# Patient Record
Sex: Male | Born: 1975 | Race: Black or African American | Hispanic: No | Marital: Single | Smoking: Current some day smoker
Health system: Southern US, Community
[De-identification: ages and names within clinical notes are randomized; demographics above are authoritative.]

## PROBLEM LIST (undated history)

## (undated) DIAGNOSIS — I10 Essential (primary) hypertension: Secondary | ICD-10-CM

## (undated) DIAGNOSIS — J45909 Unspecified asthma, uncomplicated: Secondary | ICD-10-CM

## (undated) DIAGNOSIS — E7800 Pure hypercholesterolemia, unspecified: Secondary | ICD-10-CM

---

## 2014-05-15 ENCOUNTER — Emergency Department (HOSPITAL_COMMUNITY): Payer: No Typology Code available for payment source

## 2014-05-15 ENCOUNTER — Emergency Department (HOSPITAL_COMMUNITY)
Admission: EM | Admit: 2014-05-15 | Discharge: 2014-05-15 | Disposition: A | Discharge status: Home / Self Care | Payer: No Typology Code available for payment source | Attending: Emergency Medicine | Admitting: Emergency Medicine

## 2014-05-15 ENCOUNTER — Encounter (HOSPITAL_COMMUNITY): Payer: Self-pay | Admitting: Emergency Medicine

## 2014-05-15 DIAGNOSIS — I10 Essential (primary) hypertension: Secondary | ICD-10-CM | POA: Insufficient documentation

## 2014-05-15 DIAGNOSIS — J40 Bronchitis, not specified as acute or chronic: Secondary | ICD-10-CM

## 2014-05-15 DIAGNOSIS — J45909 Unspecified asthma, uncomplicated: Secondary | ICD-10-CM | POA: Insufficient documentation

## 2014-05-15 DIAGNOSIS — Z79899 Other long term (current) drug therapy: Secondary | ICD-10-CM | POA: Insufficient documentation

## 2014-05-15 DIAGNOSIS — E7800 Pure hypercholesterolemia, unspecified: Secondary | ICD-10-CM | POA: Insufficient documentation

## 2014-05-15 DIAGNOSIS — F17200 Nicotine dependence, unspecified, uncomplicated: Secondary | ICD-10-CM | POA: Insufficient documentation

## 2014-05-15 HISTORY — DX: Essential (primary) hypertension: I10

## 2014-05-15 HISTORY — DX: Unspecified asthma, uncomplicated: J45.909

## 2014-05-15 HISTORY — DX: Pure hypercholesterolemia, unspecified: E78.00

## 2014-05-15 LAB — CBC WITH DIFFERENTIAL/PLATELET
Basophils Absolute: 0 10*3/uL (ref 0.0–0.1)
Basophils Relative: 0 % (ref 0–1)
EOS PCT: 1 % (ref 0–5)
Eosinophils Absolute: 0.1 10*3/uL (ref 0.0–0.7)
HCT: 41.5 % (ref 39.0–52.0)
HEMOGLOBIN: 14.5 g/dL (ref 13.0–17.0)
LYMPHS PCT: 21 % (ref 12–46)
Lymphs Abs: 2.4 10*3/uL (ref 0.7–4.0)
MCH: 32.1 pg (ref 26.0–34.0)
MCHC: 34.9 g/dL (ref 30.0–36.0)
MCV: 91.8 fL (ref 78.0–100.0)
MONO ABS: 1.5 10*3/uL — AB (ref 0.1–1.0)
MONOS PCT: 13 % — AB (ref 3–12)
NEUTROS ABS: 7.5 10*3/uL (ref 1.7–7.7)
Neutrophils Relative %: 65 % (ref 43–77)
Platelets: 251 10*3/uL (ref 150–400)
RBC: 4.52 MIL/uL (ref 4.22–5.81)
RDW: 12.5 % (ref 11.5–15.5)
WBC: 11.5 10*3/uL — ABNORMAL HIGH (ref 4.0–10.5)

## 2014-05-15 LAB — BASIC METABOLIC PANEL
Anion gap: 17 — ABNORMAL HIGH (ref 5–15)
BUN: 9 mg/dL (ref 6–23)
CALCIUM: 8.9 mg/dL (ref 8.4–10.5)
CHLORIDE: 99 meq/L (ref 96–112)
CO2: 23 meq/L (ref 19–32)
CREATININE: 0.94 mg/dL (ref 0.50–1.35)
GFR calc Af Amer: >90 mL/min (ref >90)
GFR calc non Af Amer: >90 mL/min (ref >90)
Glucose, Bld: 104 mg/dL — ABNORMAL HIGH (ref 70–99)
Potassium: 3.5 mEq/L — ABNORMAL LOW (ref 3.7–5.3)
Sodium: 139 mEq/L (ref 137–147)

## 2014-05-15 LAB — RAPID URINE DRUG SCREEN, HOSP PERFORMED
AMPHETAMINES: NOT DETECTED
Barbiturates: NOT DETECTED
Benzodiazepines: NOT DETECTED
COCAINE: NOT DETECTED
OPIATES: NOT DETECTED
TETRAHYDROCANNABINOL: POSITIVE — AB

## 2014-05-15 LAB — I-STAT TROPONIN, ED: Troponin i, poc: 0.01 ng/mL (ref 0.00–0.08)

## 2014-05-15 MED ORDER — PREDNISONE 20 MG PO TABS
60.0000 mg | ORAL_TABLET | Freq: Once | ORAL | Status: AC
  Administered 2014-05-15: 60 mg via ORAL
  Filled 2014-05-15: qty 3

## 2014-05-15 MED ORDER — IPRATROPIUM BROMIDE 0.02 % IN SOLN
0.5000 mg | Freq: Once | RESPIRATORY_TRACT | Status: AC
  Administered 2014-05-15: 0.5 mg via RESPIRATORY_TRACT
  Filled 2014-05-15: qty 2.5

## 2014-05-15 MED ORDER — PREDNISONE 10 MG PO TABS: 20.0000 mg | ORAL_TABLET | Freq: Every day | ORAL | Status: AC

## 2014-05-15 MED ORDER — AZITHROMYCIN 250 MG PO TABS: 250.0000 mg | ORAL_TABLET | Freq: Every day | ORAL | Status: AC

## 2014-05-15 MED ORDER — OXYCODONE-ACETAMINOPHEN 5-325 MG PO TABS: 1.0000 | ORAL_TABLET | Freq: Four times a day (QID) | ORAL | Status: AC | PRN

## 2014-05-15 MED ORDER — ALBUTEROL SULFATE (2.5 MG/3ML) 0.083% IN NEBU
5.0000 mg | INHALATION_SOLUTION | Freq: Once | RESPIRATORY_TRACT | Status: AC
  Administered 2014-05-15: 5 mg via RESPIRATORY_TRACT
  Filled 2014-05-15: qty 6

## 2014-05-15 MED ORDER — SODIUM CHLORIDE 0.9 % IV BOLUS (SEPSIS)
1000.0000 mL | Freq: Once | INTRAVENOUS | Status: AC
  Administered 2014-05-15: 1000 mL via INTRAVENOUS

## 2014-05-15 MED ORDER — KETOROLAC TROMETHAMINE 30 MG/ML IJ SOLN
30.0000 mg | Freq: Once | INTRAMUSCULAR | Status: AC
  Administered 2014-05-15: 30 mg via INTRAVENOUS
  Filled 2014-05-15: qty 1

## 2014-05-15 MED ORDER — OXYCODONE-ACETAMINOPHEN 5-325 MG PO TABS
2.0000 | ORAL_TABLET | Freq: Once | ORAL | Status: AC
  Administered 2014-05-15: 2 via ORAL
  Filled 2014-05-15: qty 2

## 2014-05-15 NOTE — ED Notes (Signed)
Pt reports he began having cp tightness and sharp pains that started yesterday after he woke up. Pt had similar episode a week ago took some percocet and it relieved pain. This time percocet did no work. Pt received 324 ASA and 2 Nitro en route, states it didn't relieve his pain. Pt has hx of HTN, High Cholesterol, and Asthma.

## 2014-05-15 NOTE — ED Notes (Signed)
Discharge instructions reviewed with pt. Pt verbalized understanding.   

## 2014-05-15 NOTE — ED Provider Notes (Signed)
Medical screening examination/treatment/procedure(s) were performed by non-physician practitioner and as supervising physician I was immediately available for consultation/collaboration.   EKG Interpretation   Date/Time:  Monday May 15 2014 03:11:33 EDT Ventricular Rate:  88 PR Interval:  136 QRS Duration: 87 QT Interval:  352 QTC Calculation: 426 R Axis:   46 Text Interpretation:  Sinus rhythm Probable left atrial enlargement  Anteroseptal infarct, old No old tracing to compare Confirmed by Livan Hires   MD, Erik Burkett ([id number]) on 05/15/2014 3:19:29 AM       Olivia Mackielga M [place], MD 05/15/14 [phone]

## 2014-05-15 NOTE — Discharge Instructions (Signed)
Bronchitis  Bronchitis is inflammation of the airways that extend from the windpipe into the lungs (bronchi). The inflammation often causes mucus to develop, which leads to a cough. If the inflammation becomes severe, it may cause shortness of breath.  CAUSES   Bronchitis may be caused by:    Viral infections.    Bacteria.    Cigarette smoke.    Allergens, pollutants, and other irritants.   SIGNS AND SYMPTOMS   The most common symptom of bronchitis is a frequent cough that produces mucus. Other symptoms include:   Fever.    Body aches.    Chest congestion.    Chills.    Shortness of breath.    Sore throat.   DIAGNOSIS   Bronchitis is usually diagnosed through a medical history and physical exam. Tests, such as chest X-rays, are sometimes done to rule out other conditions.   TREATMENT   You may need to avoid contact with whatever caused the problem (smoking, for example). Medicines are sometimes needed. These may include:   Antibiotics. These may be prescribed if the condition is caused by bacteria.   Cough suppressants. These may be prescribed for relief of cough symptoms.    Inhaled medicines. These may be prescribed to help open your airways and make it easier for you to breathe.    Steroid medicines. These may be prescribed for those with recurrent (chronic) bronchitis.  HOME CARE INSTRUCTIONS   Get plenty of rest.    Drink enough fluids to keep your urine clear or pale yellow (unless you have a medical condition that requires fluid restriction). Increasing fluids may help thin your secretions and will prevent dehydration.    Only take over-the-counter or prescription medicines as directed by your health care provider.   Only take antibiotics as directed. Make sure you finish them even if you start to feel better.   Avoid secondhand smoke, irritating chemicals, and strong fumes. These will make bronchitis worse. If you are a smoker, quit smoking. Consider using nicotine gum or  skin patches to help control withdrawal symptoms. Quitting smoking will help your lungs heal faster.    Put a cool-mist humidifier in your bedroom at night to moisten the air. This may help loosen mucus. Change the water in the humidifier daily. You can also run the hot water in your shower and sit in the bathroom with the door closed for 5-10 minutes.    Follow up with your health care provider as directed.    Wash your hands frequently to avoid catching bronchitis again or spreading an infection to others.   SEEK MEDICAL CARE IF:  Your symptoms do not improve after 1 week of treatment.   SEEK IMMEDIATE MEDICAL CARE IF:   Your fever increases.   You have chills.    You have chest pain.    You have worsening shortness of breath.    You have bloody sputum.   You faint.   You have lightheadedness.   You have a severe headache.    You vomit repeatedly.  MAKE SURE YOU:    Understand these instructions.   Will watch your condition.   Will get help right away if you are not doing well or get worse.  Document Released: 10/13/2005 Document Revised: 08/03/2013 Document Reviewed: 06/07/2013  [employer] Patient Information 2015 [employer]. This information is not intended to replace advice given to you by your health care provider. Make sure you discuss any questions you have with your health care   provider.

## 2014-05-15 NOTE — ED Provider Notes (Signed)
CSN: [id number]     Arrival date & time 05/15/14  0304 History   First MD Initiated Contact with Patient 05/15/14 [phone]     Chief Complaint  Patient presents with  . Chest Pain     (Consider location/radiation/quality/duration/timing/severity/associated sxs/prior Treatment) HPI  Patient to the ER with complaints of chest tightness and pain to the right side of his chest. It happened 2 weeks ago, he took someone else's Percocet and it relieved. Then 3 days ago it started again, he took a Percocet and it did not get better therefore he has come to the ED. He is having symptoms of pain with deep inspiration, mild cough, and pain. The pain does not radiate. His spouse says that he was "like a furnace" the last couple of nights when laying next to him in bed and worries that he may have had fever. EMS gave nitro and aspirin in route but he had no relief with this.  No lower extremity swelling, nausea, vomiting, diarrhea, weakness, confusion, headaches, tachycardia, recent travel, hx of blood clots, recent trauma or surgery.    Past Medical History  Diagnosis Date  . Hypertension   . High cholesterol   . Asthma    History reviewed. No pertinent past surgical history. No family history on file. History  Substance Use Topics  . Smoking status: Current Some Day Smoker    Types: Cigars  . Smokeless tobacco: Not on file  . Alcohol Use: Not on file     Comment: occasional     Review of Systems   Review of Systems  Gen: no weight loss, fevers, chills, night sweats  Eyes: no discharge or drainage, no occular pain or visual changes  Nose: no epistaxis or rhinorrhea  Mouth: no dental pain, no sore throat  Neck: no neck pain  Lungs:No wheezing, coughing or hemoptysis CV: + chest pain and increased effort of breathing. No  palpitations, dependent edema or orthopnea  Abd: no abdominal pain, nausea, vomiting, diarrhea GU: no dysuria or gross hematuria  MSK:  No muscle weakness or  pain Neuro: no headache, no focal neurologic deficits  Skin: no rash or wounds Psyche: no complaints    Allergies  Review of patient's allergies indicates no known allergies.  Home Medications   Prior to Admission medications   Medication Sig Start Date End Date Taking? Authorizing Provider  azithromycin (ZITHROMAX) 250 MG tablet Take 1 tablet (250 mg total) by mouth daily. Take first 2 tablets together, then 1 every day until finished. 05/15/14   Julissa Browning Irine SealG Jawaan Adachi, PA-C  losartan (COZAAR) 25 MG tablet Take 25 mg by mouth daily.   Yes Historical Provider, MD  oxyCODONE-acetaminophen (PERCOCET/ROXICET) 5-325 MG per tablet Take 1-2 tablets by mouth every 6 (six) hours as needed. 05/15/14   Latamara Melder Irine SealG Mariajose Mow, PA-C  predniSONE (DELTASONE) 10 MG tablet Take 2 tablets (20 mg total) by mouth daily. 05/15/14   Larena Ohnemus Irine SealG Saquan Furtick, PA-C  simvastatin (ZOCOR) 20 MG tablet Take 20 mg by mouth daily.   Yes Historical Provider, MD   BP 136/62  Pulse 87  Temp(Src) 99.5 F (37.5 C) (Oral)  Resp 19  Ht 5\' 10"  (1.778 m)  Wt 225 lb (102.059 kg)  BMI 32.28 kg/m2  SpO2 97% Physical Exam  Nursing note and vitals reviewed. Constitutional: He appears well-developed and well-nourished. No distress.  HENT:  Head: Normocephalic and atraumatic.  Eyes: Pupils are equal, round, and reactive to light.  Neck: Normal range of motion. Neck supple.  Cardiovascular: Normal rate and regular rhythm.   Pulmonary/Chest: Effort normal. He has decreased breath sounds (mildly decreased breath sounds diffusely.). He has no wheezes. He has no rhonchi. He has no rales. He exhibits no tenderness, no bony tenderness, no crepitus and no retraction.  Abdominal: Soft. Bowel sounds are normal.  Neurological: He is alert.  Skin: Skin is warm and dry.    ED Course  Procedures (including critical care time) Labs Review Labs Reviewed  CBC WITH DIFFERENTIAL - Abnormal; Notable for the following:    WBC 11.5 (*)    Monocytes  Relative 13 (*)    Monocytes Absolute 1.5 (*)    All other components within normal limits  BASIC METABOLIC PANEL - Abnormal; Notable for the following:    Potassium 3.5 (*)    Glucose, Bld 104 (*)    Anion gap 17 (*)    All other components within normal limits  URINE RAPID DRUG SCREEN (HOSP PERFORMED)  Rosezena Sensor, ED    Imaging Review Dg Chest 2 View  05/15/2014   CLINICAL DATA:  Right-sided chest pain.  EXAM: CHEST  2 VIEW  COMPARISON:  None.  FINDINGS: The lungs are well-aerated. Mild bibasilar airspace opacities likely reflect atelectasis. There is no evidence of pleural effusion or pneumothorax.  The heart is normal in size; the mediastinal contour is within normal limits. No acute osseous abnormalities are seen.  IMPRESSION: Mild bibasilar airspace opacities likely reflect atelectasis; lungs otherwise clear.   Electronically Signed   By: Roanna Raider M.D.   On: 05/15/2014 04:01     EKG Interpretation   Date/Time:  Monday May 15 2014 03:11:33 EDT Ventricular Rate:  88 PR Interval:  136 QRS Duration: 87 QT Interval:  352 QTC Calculation: 426 R Axis:   46 Text Interpretation:  Sinus rhythm Probable left atrial enlargement  Anteroseptal infarct, old No old tracing to compare Confirmed by OTTER   MD, OLGA ([id number]) on 05/15/2014 3:19:29 AM      MDM   Final diagnoses:  Bronchitis    Pt is PERC negative with < 2 % chance of PE. His chest xray shows bibasilar opacities atelectasis vs infection. Due to his cough, diaphoresis at night with SOB and decreased airway movement I have concerns that he may have mild pneumonia. His pain has been constant for 3 days and he has a negative Troponin and EKG is not acute.   Medications  oxyCODONE-acetaminophen (PERCOCET/ROXICET) 5-325 MG per tablet 2 tablet (not administered)  predniSONE (DELTASONE) tablet 60 mg (not administered)  albuterol (PROVENTIL) (2.5 MG/3ML) 0.083% nebulizer solution 5 mg (5 mg Nebulization Given 05/15/14  0413)  ipratropium (ATROVENT) nebulizer solution 0.5 mg (0.5 mg Nebulization Given 05/15/14 0413)  sodium chloride 0.9 % bolus 1,000 mL (1,000 mLs Intravenous New Bag/Given 05/15/14 0428)  ketorolac (TORADOL) 30 MG/ML injection 30 mg (30 mg Intravenous Given 05/15/14 0503)   Pt had some relief with medications given in the ED.  Discussed case with Dr. Norlene Campbell, pts pain very low suspicion for ACS due to pain being constant for 3 days and negative Troponin. PE unlikely due to him being PERC negative. Will treat as bronchitis with abx, steroids, and pain medications. He can follow-up with his PCP.  38 y.o.Randall Humphrey's evaluation in the Emergency Department is complete. It has been determined that no acute conditions requiring further emergency intervention are present at this time. The patient/guardian have been advised of the diagnosis and plan. We have discussed signs and symptoms that warrant  return to the ED, such as changes or worsening in symptoms.  Vital signs are stable at discharge. Filed Vitals:   05/15/14 0315  BP: 136/62  Pulse: 87  Temp: 99.5 F (37.5 C)  Resp: 19    Patient/guardian has voiced understanding and agreed to follow-up with the PCP or specialist.   Dorthula Matas, PA-C 05/15/14 [phone]

## 2016-03-13 IMAGING — CR DG CHEST 2V
2 series · 2 of 2 positions shown · non-contrast
Comparison: None.

CLINICAL DATA: Right-sided chest pain.

EXAM:
CHEST  2 VIEW

[w chest pa]
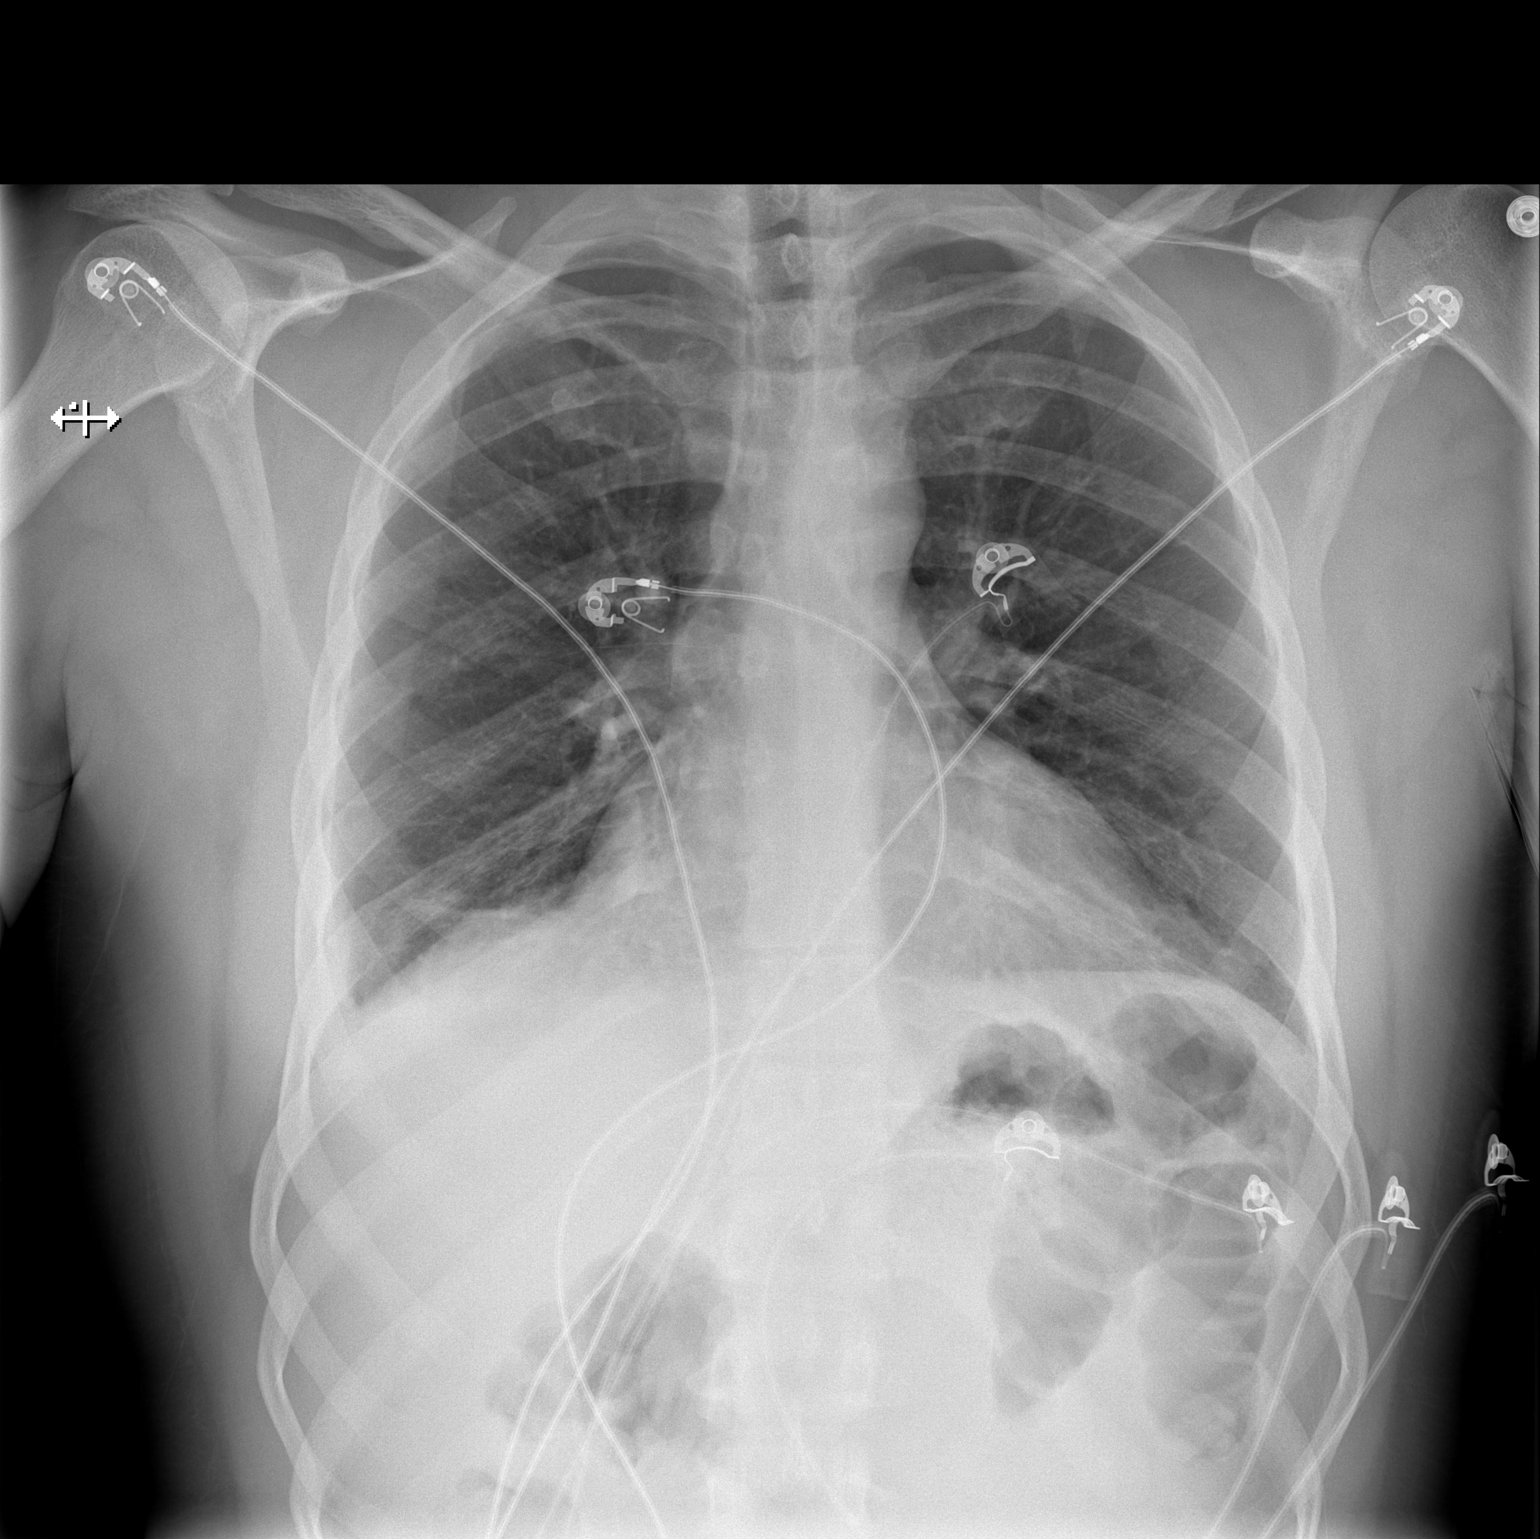

[w chest lat]
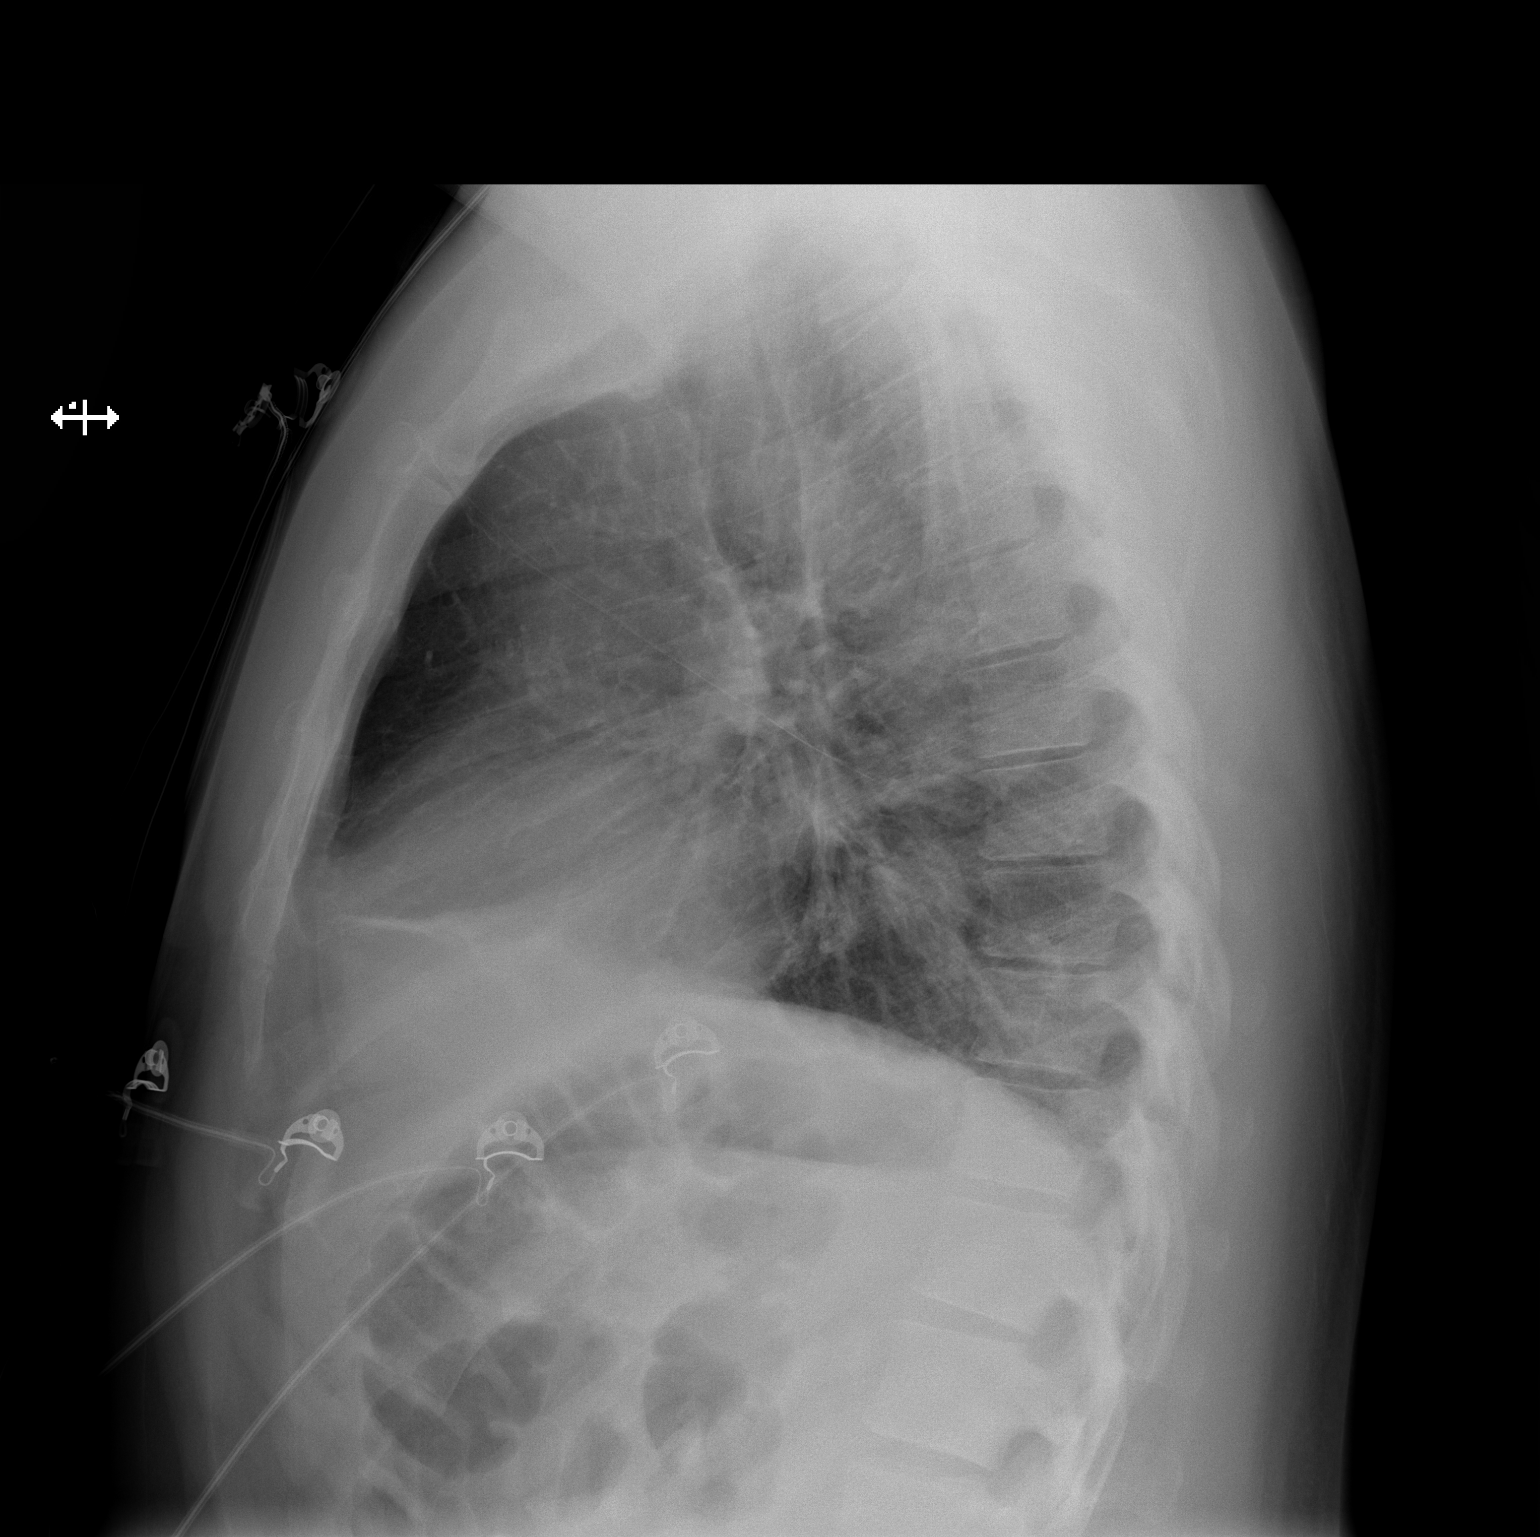

[2 of 2 positions shown; findings below may reference images not displayed]

FINDINGS: The lungs are well-aerated. Mild bibasilar airspace opacities likely
reflect atelectasis. There is no evidence of pleural effusion or
pneumothorax.

The heart is normal in size; the mediastinal contour is within
normal limits. No acute osseous abnormalities are seen.
IMPRESSION: Mild bibasilar airspace opacities likely reflect atelectasis; lungs
otherwise clear.
